# Patient Record
Sex: Female | Born: 1935
Health system: Southern US, Community
[De-identification: ages and names within clinical notes are randomized; demographics above are authoritative.]

## PROBLEM LIST (undated history)

## (undated) DIAGNOSIS — I10 Essential (primary) hypertension: Secondary | ICD-10-CM

## (undated) DIAGNOSIS — K5792 Diverticulitis of intestine, part unspecified, without perforation or abscess without bleeding: Secondary | ICD-10-CM

## (undated) DIAGNOSIS — N289 Disorder of kidney and ureter, unspecified: Secondary | ICD-10-CM

## (undated) HISTORY — PX: FRACTURE SURGERY: SHX138

## (undated) HISTORY — PX: ABDOMINAL HYSTERECTOMY: SHX81

---

## 1999-07-27 ENCOUNTER — Ambulatory Visit (HOSPITAL_COMMUNITY): Admission: RE | Admit: 1999-07-27 | Discharge: 1999-07-27 | Payer: Self-pay | Admitting: *Deleted

## 2000-03-04 ENCOUNTER — Encounter: Admission: RE | Admit: 2000-03-04 | Discharge: 2000-03-04 | Payer: Self-pay | Admitting: Internal Medicine

## 2000-03-04 ENCOUNTER — Encounter: Payer: Self-pay | Admitting: Internal Medicine

## 2000-10-25 ENCOUNTER — Other Ambulatory Visit: Admission: RE | Admit: 2000-10-25 | Discharge: 2000-10-25 | Payer: Self-pay | Admitting: Internal Medicine

## 2001-03-10 ENCOUNTER — Encounter: Admission: RE | Admit: 2001-03-10 | Discharge: 2001-03-10 | Payer: Self-pay | Admitting: Internal Medicine

## 2001-03-10 ENCOUNTER — Encounter: Payer: Self-pay | Admitting: Internal Medicine

## 2002-03-29 ENCOUNTER — Encounter: Admission: RE | Admit: 2002-03-29 | Discharge: 2002-03-29 | Payer: Self-pay | Admitting: Internal Medicine

## 2002-03-29 ENCOUNTER — Encounter: Payer: Self-pay | Admitting: Internal Medicine

## 2003-05-23 ENCOUNTER — Encounter: Payer: Self-pay | Admitting: Internal Medicine

## 2003-05-23 ENCOUNTER — Encounter: Admission: RE | Admit: 2003-05-23 | Discharge: 2003-05-23 | Payer: Self-pay | Admitting: Internal Medicine

## 2004-07-06 ENCOUNTER — Encounter: Admission: RE | Admit: 2004-07-06 | Discharge: 2004-07-06 | Payer: Self-pay | Admitting: Internal Medicine

## 2005-10-04 ENCOUNTER — Encounter: Admission: RE | Admit: 2005-10-04 | Discharge: 2005-10-04 | Payer: Self-pay | Admitting: Internal Medicine

## 2006-10-05 ENCOUNTER — Encounter: Admission: RE | Admit: 2006-10-05 | Discharge: 2006-10-05 | Payer: Self-pay | Admitting: Internal Medicine

## 2007-08-11 ENCOUNTER — Encounter: Admission: RE | Admit: 2007-08-11 | Discharge: 2007-08-11 | Payer: Self-pay | Admitting: Internal Medicine

## 2007-11-24 ENCOUNTER — Encounter: Admission: RE | Admit: 2007-11-24 | Discharge: 2007-11-24 | Payer: Self-pay

## 2007-12-05 ENCOUNTER — Encounter: Admission: RE | Admit: 2007-12-05 | Discharge: 2007-12-05 | Payer: Self-pay

## 2009-03-27 ENCOUNTER — Encounter: Admission: RE | Admit: 2009-03-27 | Discharge: 2009-03-27 | Payer: Self-pay | Admitting: Family Medicine

## 2018-03-02 ENCOUNTER — Emergency Department (HOSPITAL_BASED_OUTPATIENT_CLINIC_OR_DEPARTMENT_OTHER)
Admission: EM | Admit: 2018-03-02 | Discharge: 2018-03-02 | Disposition: A | Discharge status: Home / Self Care | Payer: Medicare HMO | Attending: Physician Assistant | Admitting: Physician Assistant

## 2018-03-02 ENCOUNTER — Other Ambulatory Visit: Payer: Self-pay

## 2018-03-02 ENCOUNTER — Emergency Department (HOSPITAL_BASED_OUTPATIENT_CLINIC_OR_DEPARTMENT_OTHER): Payer: Medicare HMO

## 2018-03-02 ENCOUNTER — Encounter: Payer: Self-pay | Admitting: Emergency Medicine

## 2018-03-02 DIAGNOSIS — R1030 Lower abdominal pain, unspecified: Secondary | ICD-10-CM | POA: Diagnosis present

## 2018-03-02 DIAGNOSIS — K529 Noninfective gastroenteritis and colitis, unspecified: Secondary | ICD-10-CM | POA: Diagnosis not present

## 2018-03-02 HISTORY — DX: Essential (primary) hypertension: I10

## 2018-03-02 HISTORY — DX: Disorder of kidney and ureter, unspecified: N28.9

## 2018-03-02 HISTORY — DX: Diverticulitis of intestine, part unspecified, without perforation or abscess without bleeding: K57.92

## 2018-03-02 LAB — COMPREHENSIVE METABOLIC PANEL
ALT: 17 U/L (ref 14–54)
AST: 27 U/L (ref 15–41)
Albumin: 4.3 g/dL (ref 3.5–5.0)
Alkaline Phosphatase: 60 U/L (ref 38–126)
Anion gap: 11 (ref 5–15)
BUN: 47 mg/dL — AB (ref 6–20)
CHLORIDE: 106 mmol/L (ref 101–111)
CO2: 19 mmol/L — AB (ref 22–32)
CREATININE: 2.58 mg/dL — AB (ref 0.44–1.00)
Calcium: 8.8 mg/dL — ABNORMAL LOW (ref 8.9–10.3)
GFR calc non Af Amer: 16 mL/min — ABNORMAL LOW (ref >60)
GFR, EST AFRICAN AMERICAN: 18 mL/min — AB (ref >60)
Glucose, Bld: 124 mg/dL — ABNORMAL HIGH (ref 65–99)
Potassium: 5 mmol/L (ref 3.5–5.1)
SODIUM: 136 mmol/L (ref 135–145)
Total Bilirubin: 0.9 mg/dL (ref 0.3–1.2)
Total Protein: 7.1 g/dL (ref 6.5–8.1)

## 2018-03-02 LAB — URINALYSIS, ROUTINE W REFLEX MICROSCOPIC
BILIRUBIN URINE: NEGATIVE
GLUCOSE, UA: NEGATIVE mg/dL
KETONES UR: NEGATIVE mg/dL
Nitrite: NEGATIVE
PROTEIN: NEGATIVE mg/dL
Specific Gravity, Urine: 1.02 (ref 1.005–1.030)
pH: 5.5 (ref 5.0–8.0)

## 2018-03-02 LAB — CBC
HCT: 33.7 % — ABNORMAL LOW (ref 36.0–46.0)
Hemoglobin: 11.6 g/dL — ABNORMAL LOW (ref 12.0–15.0)
MCH: 35 pg — AB (ref 26.0–34.0)
MCHC: 34.4 g/dL (ref 30.0–36.0)
MCV: 101.8 fL — AB (ref 78.0–100.0)
PLATELETS: 226 10*3/uL (ref 150–400)
RBC: 3.31 MIL/uL — AB (ref 3.87–5.11)
RDW: 11.8 % (ref 11.5–15.5)
WBC: 12.1 10*3/uL — ABNORMAL HIGH (ref 4.0–10.5)

## 2018-03-02 LAB — URINALYSIS, MICROSCOPIC (REFLEX)

## 2018-03-02 LAB — LIPASE, BLOOD: LIPASE: 30 U/L (ref 11–51)

## 2018-03-02 MED ORDER — SODIUM CHLORIDE 0.9 % IV BOLUS
1000.0000 mL | Freq: Once | INTRAVENOUS | Status: AC
  Administered 2018-03-02: 1000 mL via INTRAVENOUS

## 2018-03-02 MED ORDER — CIPROFLOXACIN HCL 500 MG PO TABS: 500.0000 mg | ORAL_TABLET | Freq: Two times a day (BID) | ORAL | 0 refills | Status: DC

## 2018-03-02 MED ORDER — ONDANSETRON HCL 4 MG/2ML IJ SOLN
4.0000 mg | Freq: Once | INTRAMUSCULAR | Status: AC
  Administered 2018-03-02: 4 mg via INTRAVENOUS
  Filled 2018-03-02: qty 2

## 2018-03-02 MED ORDER — METRONIDAZOLE 500 MG PO TABS: 500.0000 mg | ORAL_TABLET | Freq: Two times a day (BID) | ORAL | 0 refills | Status: DC

## 2018-03-02 NOTE — ED Provider Notes (Signed)
[facility] EMERGENCY DEPARTMENT Provider Note   CSN: [id number] Arrival date & time: 03/02/18  1657     History   Chief Complaint Chief Complaint  Patient presents with  . Abdominal Pain    HPI Tracie Tate is a 84 y.o. female.  HPI   Ms. Lewan is an 84yo female with a history of hypertension, diverticulitis, CKD stage III, recurrent UTI who presents emergency department for evaluation of vomiting, diarrhea and lower abdominal pain.  Patient reports that she ate pork and cornbread last night.  About an hour after eating she had a large volume of nonbloody emesis.  She then proceeded to have 4 episodes of nonbloody diarrhea overnight.  She states that she had some bilateral lower abdominal cramping with diarrhea, but denies abdominal pain currently.  She states that she has not had anything but ice chips today and one frozen grape.  She has not voided today.  States that over the past 2 days she has had some burning with urination, although she states this is typical for her and she normally takes Azo's for recurrent UTIs.  States that she felt chills overnight, but denies measured fever.  She feels overall weak.  She denies chest pain, shortness of breath, melena, hematochezia, vaginal bleeding, vaginal discharge, lightheadedness, syncope.  She has had an abdominal hysterectomy procedure, denies any other abdominal surgeries.    Past Medical History:  Diagnosis Date  . Diverticulitis   . Hypertension   . Renal disorder     There are no active problems to display for this patient.   History reviewed. No pertinent surgical history.   OB History   None      Home Medications    Prior to Admission medications   Not on File    Family History History reviewed. No pertinent family history.  Social History Social History   Tobacco Use  . Smoking status: Never Smoker  . Smokeless tobacco: Never Used  Substance Use Topics  . Alcohol use: Never   Frequency: Never  . Drug use: Never     Allergies   Nitrofurantoin; Penicillins; Sulfa antibiotics; Amitriptyline; Fluticasone; and Simvastatin   Review of Systems Review of Systems  Constitutional: Positive for chills. Negative for fever.  Eyes: Negative for visual disturbance.  Respiratory: Negative for shortness of breath.   Cardiovascular: Negative for chest pain.  Gastrointestinal: Positive for abdominal pain, diarrhea and vomiting. Negative for blood in stool.  Genitourinary: Positive for dysuria. Negative for frequency, hematuria and vaginal discharge.  Musculoskeletal: Negative for gait problem.  Skin: Negative for rash.  Neurological: Positive for weakness. Negative for syncope, light-headedness, numbness and headaches.  Psychiatric/Behavioral: Negative for agitation.     Physical Exam Updated Vital Signs BP (!) 121/56 (BP Location: Right Arm)   Pulse 99   Temp 98.3 F (36.8 C) (Oral)   Resp 18   SpO2 98%   Physical Exam  Constitutional: She is oriented to person, place, and time. She appears well-developed and well-nourished. No distress.  Non-toxic appearing. NAD.   HENT:  Head: Normocephalic and atraumatic.  Mouth/Throat: Oropharynx is clear and moist.  Mucous membranes somewhat dry.   Eyes: Right eye exhibits no discharge. Left eye exhibits no discharge.  Cardiovascular: Normal rate and regular rhythm.  Pulmonary/Chest: Effort normal and breath sounds normal. No stridor. No respiratory distress. She has no wheezes. She has no rales.  Abdominal:  Abdomen soft and non-distended. Diffusely tender to palpation, worse over the right peri-umbilical area.  No guarding or rigidity. No rebound tenderness. No CVA tenderness.   Neurological: She is alert and oriented to person, place, and time. Coordination normal.  Skin: Skin is warm and dry. Capillary refill takes less than 2 seconds. She is not diaphoretic.  Psychiatric: She has a normal mood and affect. Her  behavior is normal.  Nursing note and vitals reviewed.    ED Treatments / Results  Labs (all labs ordered are listed, but only abnormal results are displayed) Labs Reviewed  COMPREHENSIVE METABOLIC PANEL - Abnormal; Notable for the following components:      Result Value   CO2 19 (*)    Glucose, Bld 124 (*)    BUN 47 (*)    Creatinine, Ser 2.58 (*)    Calcium 8.8 (*)    GFR calc non Af Amer 16 (*)    GFR calc Af Amer 18 (*)    All other components within normal limits  CBC - Abnormal; Notable for the following components:   WBC 12.1 (*)    RBC 3.31 (*)    Hemoglobin 11.6 (*)    HCT 33.7 (*)    MCV 101.8 (*)    MCH 35.0 (*)    All other components within normal limits  LIPASE, BLOOD  URINALYSIS, ROUTINE W REFLEX MICROSCOPIC    EKG None  Radiology No results found.  Procedures Procedures (including critical care time)  Medications Ordered in ED Medications  sodium chloride 0.9 % bolus 1,000 mL (1,000 mLs Intravenous New Bag/Given 03/02/18 1742)  sodium chloride 0.9 % bolus 1,000 mL (1,000 mLs Intravenous New Bag/Given 03/02/18 1915)  ondansetron (ZOFRAN) injection 4 mg (4 mg Intravenous Given 03/02/18 1914)     Initial Impression / Assessment and Plan / ED Course  I have reviewed the triage vital signs and the nursing notes.  Pertinent labs & imaging results that were available during my care of the patient were reviewed by me and considered in my medical decision making (see chart for details).     84 year old female who resents emergency department for evaluation of vomiting, diarrhea and lower abdominal cramping.  This started yesterday.  Other than ice chips, she has not had anything to drink or eat today.  Has not voided today.   On exam she is afebrile and nontoxic-appearing.  She is diffusely tender over the abdomen, no peritoneal signs.  Mucous membranes dry.  Labs reviewed.  She has an AKI with creatinine 2.58 (baseline 1.5).  Suspect that this is  prerenal in the setting of poor fluid intake today.  Fluid bolus running.  She also has a mild leukocytosis with WBC count 12.1.  She has a macrocytic anemia with hemoglobin 11.6.  Lipase within normal.  She has not voided in the ED to collect UA sample. CT abdomen noncontrast pending.   Sign out given to MD Kandis Mannan at shift change for disposition following return of CT scan.   Final Clinical Impressions(s) / ED Diagnoses   Final diagnoses:  None    ED Discharge Orders    None       Lawrence Marseilles 03/02/18 2005    Mackuen, Cindee Salt, MD 03/02/18 2211

## 2018-03-02 NOTE — Discharge Instructions (Signed)
You were seen today with an infection in your bowels.  We are giving antibiotics to help resolve that.  Please follow-up with your primary care as soon as possible to get your labs redone.  We recommended that you would be admitted to hospital but he did not wish to at this time.  If you at any time feel like you would like to change your mind you are welcome back.  He is really tried to stay hydrated with a mix of Gatorade and water.

## 2018-03-02 NOTE — ED Triage Notes (Signed)
Present with diarrhea and vomiting that began last night, dneis fever, endorses chills. Reports lower abdominal pain.

## 2018-03-02 NOTE — ED Notes (Signed)
Pt able to tolerate PO fluids no sign of nausea or vomiting.

## 2018-10-04 ENCOUNTER — Emergency Department (HOSPITAL_BASED_OUTPATIENT_CLINIC_OR_DEPARTMENT_OTHER): Payer: Medicare HMO

## 2018-10-04 ENCOUNTER — Emergency Department (HOSPITAL_BASED_OUTPATIENT_CLINIC_OR_DEPARTMENT_OTHER)
Admission: EM | Admit: 2018-10-04 | Discharge: 2018-10-04 | Disposition: A | Discharge status: Home / Self Care | Payer: Medicare HMO | Attending: Emergency Medicine | Admitting: Emergency Medicine

## 2018-10-04 ENCOUNTER — Encounter (HOSPITAL_BASED_OUTPATIENT_CLINIC_OR_DEPARTMENT_OTHER): Payer: Self-pay | Admitting: Emergency Medicine

## 2018-10-04 ENCOUNTER — Other Ambulatory Visit: Payer: Self-pay

## 2018-10-04 DIAGNOSIS — N390 Urinary tract infection, site not specified: Secondary | ICD-10-CM

## 2018-10-04 DIAGNOSIS — Z79899 Other long term (current) drug therapy: Secondary | ICD-10-CM | POA: Insufficient documentation

## 2018-10-04 DIAGNOSIS — R531 Weakness: Secondary | ICD-10-CM

## 2018-10-04 DIAGNOSIS — Z7982 Long term (current) use of aspirin: Secondary | ICD-10-CM | POA: Insufficient documentation

## 2018-10-04 DIAGNOSIS — I10 Essential (primary) hypertension: Secondary | ICD-10-CM | POA: Diagnosis not present

## 2018-10-04 LAB — CBC WITH DIFFERENTIAL/PLATELET
ABS IMMATURE GRANULOCYTES: 0.01 10*3/uL (ref 0.00–0.07)
BASOS ABS: 0 10*3/uL (ref 0.0–0.1)
BASOS PCT: 1 %
Eosinophils Absolute: 0 10*3/uL (ref 0.0–0.5)
Eosinophils Relative: 0 %
HCT: 36.2 % (ref 36.0–46.0)
Hemoglobin: 11.5 g/dL — ABNORMAL LOW (ref 12.0–15.0)
IMMATURE GRANULOCYTES: 0 %
LYMPHS ABS: 1.4 10*3/uL (ref 0.7–4.0)
Lymphocytes Relative: 37 %
MCH: 33.1 pg (ref 26.0–34.0)
MCHC: 31.8 g/dL (ref 30.0–36.0)
MCV: 104.3 fL — AB (ref 80.0–100.0)
Monocytes Absolute: 0.4 10*3/uL (ref 0.1–1.0)
Monocytes Relative: 11 %
NEUTROS ABS: 1.9 10*3/uL (ref 1.7–7.7)
NRBC: 0 % (ref 0.0–0.2)
Neutrophils Relative %: 51 %
PLATELETS: 175 10*3/uL (ref 150–400)
RBC: 3.47 MIL/uL — ABNORMAL LOW (ref 3.87–5.11)
RDW: 11.9 % (ref 11.5–15.5)
WBC: 3.7 10*3/uL — ABNORMAL LOW (ref 4.0–10.5)

## 2018-10-04 LAB — URINALYSIS, ROUTINE W REFLEX MICROSCOPIC
Glucose, UA: NEGATIVE mg/dL
KETONES UR: 15 mg/dL — AB
NITRITE: POSITIVE — AB
PROTEIN: 30 mg/dL — AB
Specific Gravity, Urine: 1.025 (ref 1.005–1.030)
pH: 6 (ref 5.0–8.0)

## 2018-10-04 LAB — COMPREHENSIVE METABOLIC PANEL
ALBUMIN: 3.9 g/dL (ref 3.5–5.0)
ALT: 16 U/L (ref 0–44)
ANION GAP: 9 (ref 5–15)
AST: 27 U/L (ref 15–41)
Alkaline Phosphatase: 47 U/L (ref 38–126)
BUN: 26 mg/dL — ABNORMAL HIGH (ref 8–23)
CO2: 22 mmol/L (ref 22–32)
Calcium: 8.7 mg/dL — ABNORMAL LOW (ref 8.9–10.3)
Chloride: 103 mmol/L (ref 98–111)
Creatinine, Ser: 1.6 mg/dL — ABNORMAL HIGH (ref 0.44–1.00)
GFR calc Af Amer: 33 mL/min — ABNORMAL LOW (ref >60)
GFR calc non Af Amer: 28 mL/min — ABNORMAL LOW (ref >60)
GLUCOSE: 92 mg/dL (ref 70–99)
POTASSIUM: 4.7 mmol/L (ref 3.5–5.1)
SODIUM: 134 mmol/L — AB (ref 135–145)
TOTAL PROTEIN: 7.3 g/dL (ref 6.5–8.1)
Total Bilirubin: 0.9 mg/dL (ref 0.3–1.2)

## 2018-10-04 LAB — URINALYSIS, MICROSCOPIC (REFLEX): WBC, UA: >50 WBC/hpf (ref 0–5)

## 2018-10-04 LAB — I-STAT CG4 LACTIC ACID, ED: Lactic Acid, Venous: 1.23 mmol/L (ref 0.5–1.9)

## 2018-10-04 MED ORDER — SODIUM CHLORIDE 0.9 % IV SOLN
INTRAVENOUS | Status: DC | PRN
  Administered 2018-10-04: 250 mL via INTRAVENOUS

## 2018-10-04 MED ORDER — SODIUM CHLORIDE 0.9 % IV SOLN
1.0000 g | Freq: Once | INTRAVENOUS | Status: AC
  Administered 2018-10-04: 1 g via INTRAVENOUS
  Filled 2018-10-04: qty 10

## 2018-10-04 MED ORDER — SODIUM CHLORIDE 0.9 % IV BOLUS
1000.0000 mL | Freq: Once | INTRAVENOUS | Status: AC
  Administered 2018-10-04: 1000 mL via INTRAVENOUS

## 2018-10-04 MED ORDER — CEPHALEXIN 500 MG PO CAPS: 500.0000 mg | ORAL_CAPSULE | Freq: Two times a day (BID) | ORAL | 0 refills | Status: AC

## 2018-10-04 MED FILL — CEPHALEXIN 500 MG CAPSULE: 500 | 7 days supply | Qty: 14 | Fill #0

## 2018-10-04 NOTE — ED Notes (Signed)
Patient transported to X-ray 

## 2018-10-04 NOTE — ED Notes (Signed)
ED Provider at bedside. 

## 2018-10-04 NOTE — ED Provider Notes (Signed)
[facility] EMERGENCY DEPARTMENT Provider Note   CSN: [id number] Arrival date & time: 10/04/18  [id number]     History   Chief Complaint Chief Complaint  Patient presents with  . requesting fluids    HPI Tracie Tate is a 84 y.o. female.  84yo F w/ PMH including diverticulitis/colitis, HTN who p/w weakness and cough. PT reports h/o colitis that usually causes significant diarrhea for 1 day. She had non-bloody diarrhea overnight ~3 days ago that resolved. No ongoing diarrhea but she feels weak like she needs IV fluids. She reports a few days of cough that she thought was allergies but now thinks is a cold. She reports associated sore throat and runny nose. No vomiting. Has had some burning w/ urination that improved w/ AZO a few days ago. No complaints of pain. No sick contacts or fever.  The history is provided by the patient.    Past Medical History:  Diagnosis Date  . Diverticulitis   . Hypertension   . Renal disorder     There are no active problems to display for this patient.   Past Surgical History:  Procedure Laterality Date  . ABDOMINAL HYSTERECTOMY    . FRACTURE SURGERY       OB History   No obstetric history on file.      Home Medications    Prior to Admission medications   Medication Sig Start Date End Date Taking? Authorizing Provider  aspirin EC 81 MG tablet Take by mouth. 01/22/16  Yes [provider]  cephALEXin (KEFLEX) 500 MG capsule Take 1 capsule (500 mg total) by mouth 2 (two) times daily. 10/04/18   Little, Ambrose Finlandachel Morgan, MD  lisinopril (PRINIVIL,ZESTRIL) 10 MG tablet Take 10 mg by mouth daily.    [provider]  Omega-3 1000 MG CAPS Take by mouth.    [provider]  traMADol (ULTRAM) 50 MG tablet Take by mouth.    [provider]    Family History No family history on file.  Social History Social History   Tobacco Use  . Smoking status: Never Smoker  . Smokeless tobacco: Never  Used  Substance Use Topics  . Alcohol use: Never    Frequency: Never  . Drug use: Never     Allergies   Nitrofurantoin; Penicillins; Sulfa antibiotics; Amitriptyline; Fluticasone; and Simvastatin   Review of Systems Review of Systems All other systems reviewed and are negative except that which was mentioned in HPI   Physical Exam Updated Vital Signs BP (!) 154/92 (BP Location: Right Arm)   Pulse 92   Temp 98.2 F (36.8 C) (Oral)   Resp 18   Ht 5\' 3"  (1.6 m)   Wt 61.7 kg   SpO2 100%   BMI 24.09 kg/m   Physical Exam Vitals signs and nursing note reviewed.  Constitutional:      General: She is not in acute distress.    Appearance: She is well-developed.  HENT:     Head: Normocephalic and atraumatic.     Mouth/Throat:     Mouth: Mucous membranes are moist.     Pharynx: Oropharynx is clear. No oropharyngeal exudate or posterior oropharyngeal erythema.  Eyes:     Conjunctiva/sclera: Conjunctivae normal.  Neck:     Musculoskeletal: Neck supple.  Cardiovascular:     Rate and Rhythm: Normal rate and regular rhythm.     Heart sounds: Normal heart sounds. No murmur.  Pulmonary:     Effort: Pulmonary effort is normal.  Breath sounds: Normal breath sounds.  Abdominal:     General: Bowel sounds are normal. There is no distension.     Palpations: Abdomen is soft.     Tenderness: There is no abdominal tenderness.  Skin:    General: Skin is warm and dry.  Neurological:     Mental Status: She is alert and oriented to person, place, and time.     Comments: Fluent speech  Psychiatric:        Judgment: Judgment normal.      ED Treatments / Results  Labs (all labs ordered are listed, but only abnormal results are displayed) Labs Reviewed  COMPREHENSIVE METABOLIC PANEL - Abnormal; Notable for the following components:      Result Value   Sodium 134 (*)    BUN 26 (*)    Creatinine, Ser 1.60 (*)    Calcium 8.7 (*)    GFR calc non Af Amer 28 (*)    GFR calc Af  Amer 33 (*)    All other components within normal limits  CBC WITH DIFFERENTIAL/PLATELET - Abnormal; Notable for the following components:   WBC 3.7 (*)    RBC 3.47 (*)    Hemoglobin 11.5 (*)    MCV 104.3 (*)    All other components within normal limits  URINALYSIS, ROUTINE W REFLEX MICROSCOPIC - Abnormal; Notable for the following components:   APPearance CLOUDY (*)    Hgb urine dipstick SMALL (*)    Bilirubin Urine SMALL (*)    Ketones, ur 15 (*)    Protein, ur 30 (*)    Nitrite POSITIVE (*)    Leukocytes, UA MODERATE (*)    All other components within normal limits  URINALYSIS, MICROSCOPIC (REFLEX) - Abnormal; Notable for the following components:   Bacteria, UA MANY (*)    All other components within normal limits  URINE CULTURE  I-STAT CG4 LACTIC ACID, ED  I-STAT CG4 LACTIC ACID, ED    EKG None  Radiology Dg Chest 2 View  Result Date: 10/04/2018 CLINICAL DATA:  Weakness and cough for several days EXAM: CHEST - 2 VIEW COMPARISON:  None. FINDINGS: The heart size and mediastinal contours are within normal limits. Both lungs are clear. The visualized skeletal structures are unremarkable. Calcified granuloma is noted in the left mid lung. IMPRESSION: No active cardiopulmonary disease. Electronically Signed   By: Alcide Clever M.D.   On: 10/04/2018 10:02    Procedures Procedures (including critical care time)  Medications Ordered in ED Medications  0.9 %  sodium chloride infusion (250 mLs Intravenous New Bag/Given 10/04/18 1116)  sodium chloride 0.9 % bolus 1,000 mL ( Intravenous Stopped 10/04/18 1134)  cefTRIAXone (ROCEPHIN) 1 g in sodium chloride 0.9 % 100 mL IVPB (1 g Intravenous New Bag/Given 10/04/18 1117)     Initial Impression / Assessment and Plan / ED Course  I have reviewed the triage vital signs and the nursing notes.  Pertinent labs & imaging results that were available during my care of the patient were reviewed by me and considered in my medical decision  making (see chart for details).    PT comfortable on exam w/ reassuring VS, afebrile. Clear lung sounds, abd non-tender. CXR clear. Labs show cr 1.6 which is improved from previous, WBC 3.7 which may be related to viral URI symptoms. Lactate normal. UA c/w infection.  May be what is driving her generalized weakness, especially considering diarrhea has not occurred for 2 days.  Her chart shows that she has  tolerated cephalosporins previously.  Gave ceftriaxone here and a course of Keflex to complete at home.  She feels better after fluids and antibiotics is well-appearing on reassessment.  Discussed follow-up with PCP to ensure improvement and reviewed return precautions.  She voiced understanding.  Final Clinical Impressions(s) / ED Diagnoses   Final diagnoses:  Urinary tract infection without hematuria, site unspecified  Generalized weakness    ED Discharge Orders         Ordered    cephALEXin (KEFLEX) 500 MG capsule  2 times daily    Note to Pharmacy:  Has tolerated cephalosporins previously   10/04/18 1209           Little, Ambrose Finland, MD 10/04/18 1212

## 2018-10-04 NOTE — ED Notes (Signed)
ED Provider at bedside discussing test results and dispo plan of care. 

## 2018-10-04 NOTE — ED Triage Notes (Addendum)
Hx of colitis.  Pt sts she has bouts of diarrhea for which she must be rehydrated and pmd sent her here today for that rehydration. Pt  Had diarrhea on Sunday but has not had it for the past 2 days. Pt also has cough and head cold x4 days.

## 2018-10-07 LAB — URINE CULTURE: Culture: >=100000 — AB

## 2018-11-09 IMAGING — CT CT ABD-PELV W/O CM
2 of 4 series · 16 of 46 positions shown, 18 images · non-contrast
Comparison: 08/11/2007

CLINICAL DATA: Lower abdominal pain

EXAM:
CT ABDOMEN AND PELVIS WITHOUT CONTRAST
TECHNIQUE: Multidetector CT imaging of the abdomen and pelvis was performed
following the standard protocol without IV contrast.

[Series 2: axial st · axial · 0.73mm/px · z∈[-551,-131]mm · 13 of 92 slices shown, 15 images]
[im 4/92  soft-tissue]
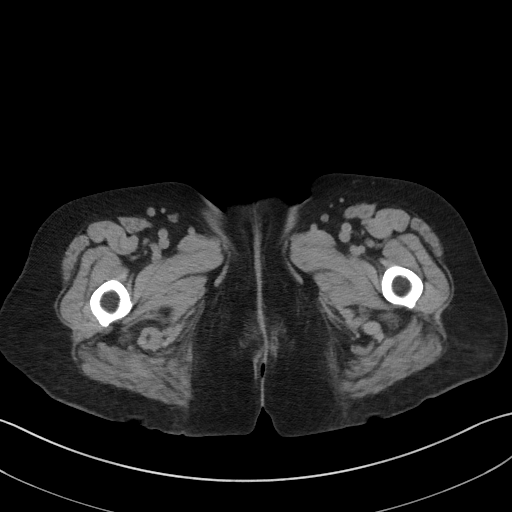
[im 4/92  bone]
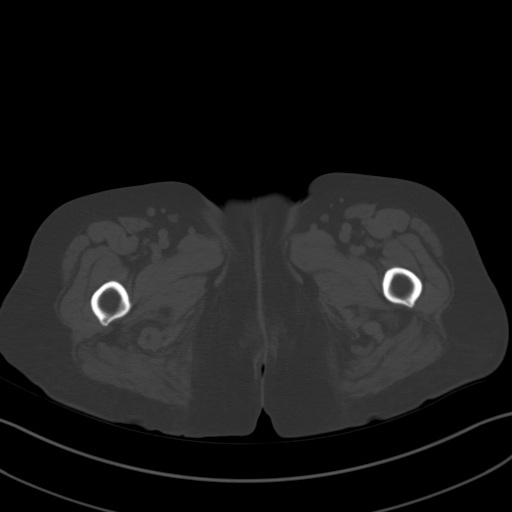
[im 12/92  soft-tissue]
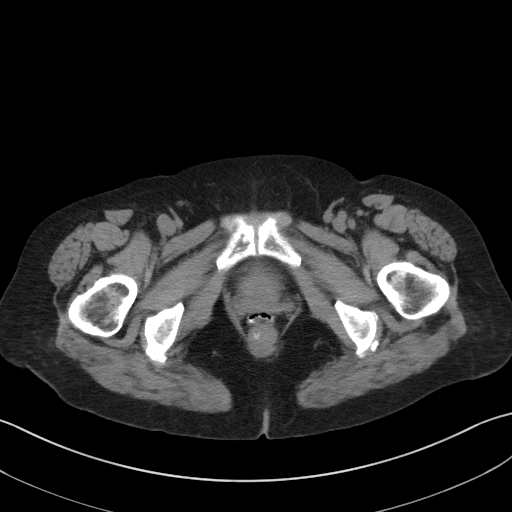
[im 19/92  soft-tissue]
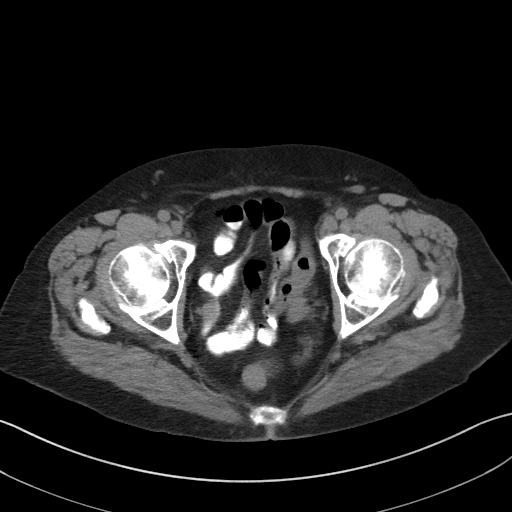
[im 27/92  soft-tissue]
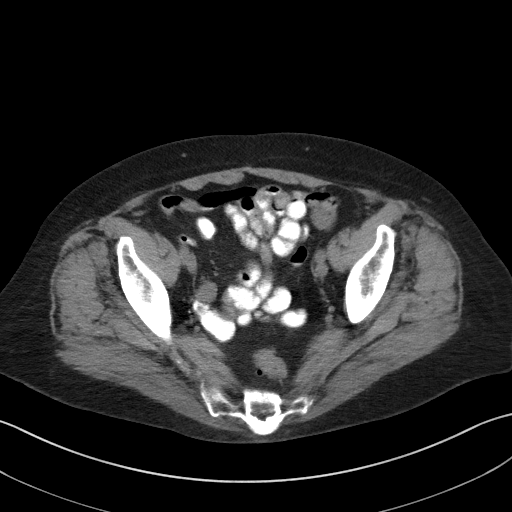
[im 31/92  soft-tissue]
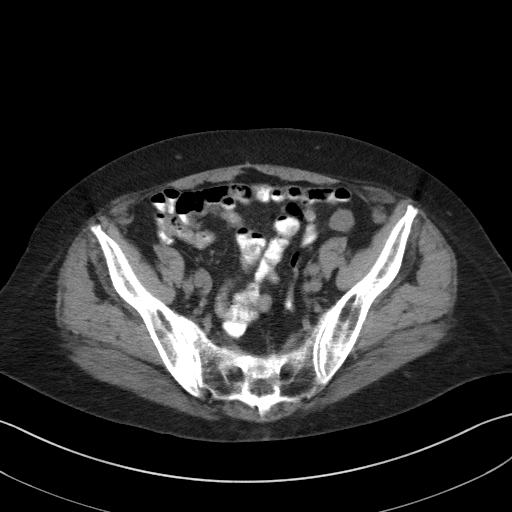
[im 38/92  soft-tissue]
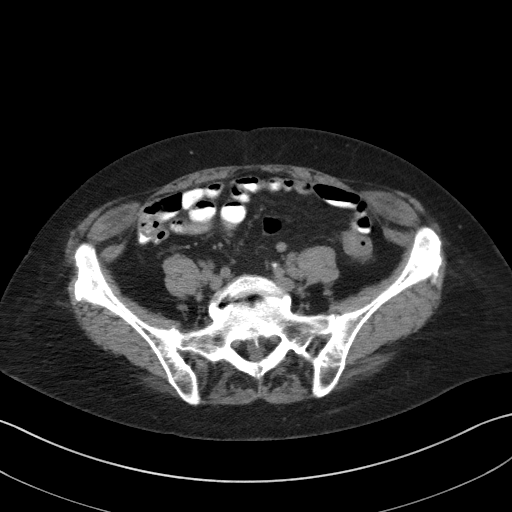
[im 46/92  soft-tissue]
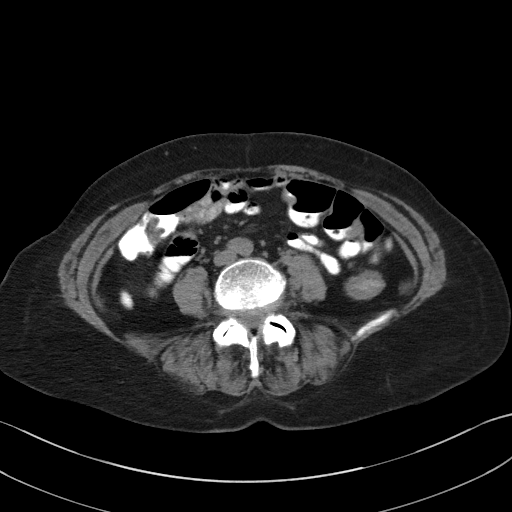
[im 54/92  soft-tissue]
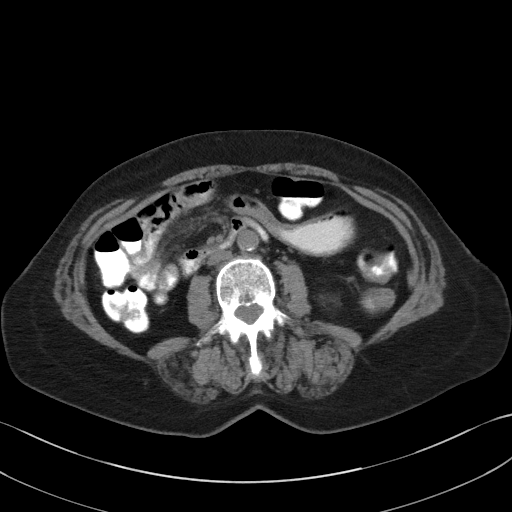
[im 61/92  soft-tissue]
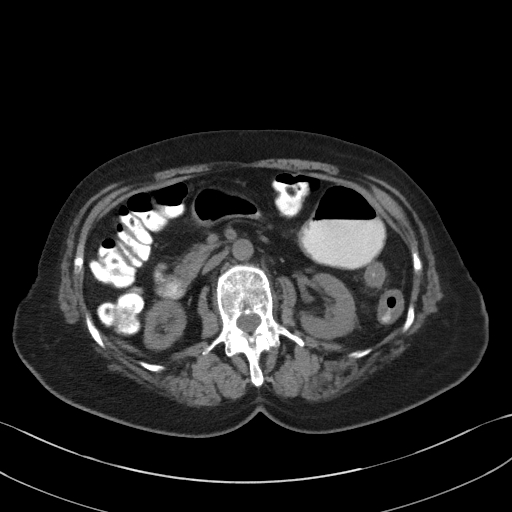
[im 61/92  bone]
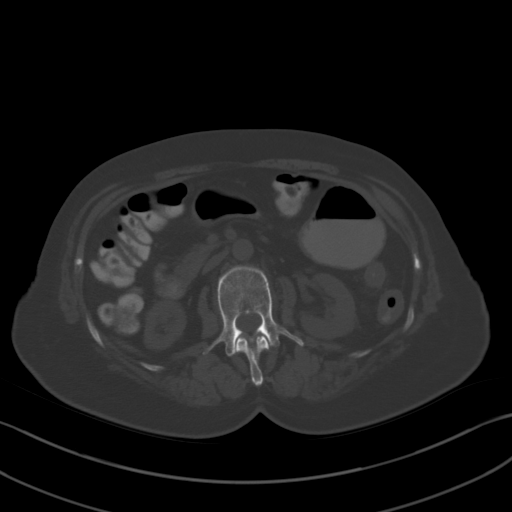
[im 65/92  soft-tissue]
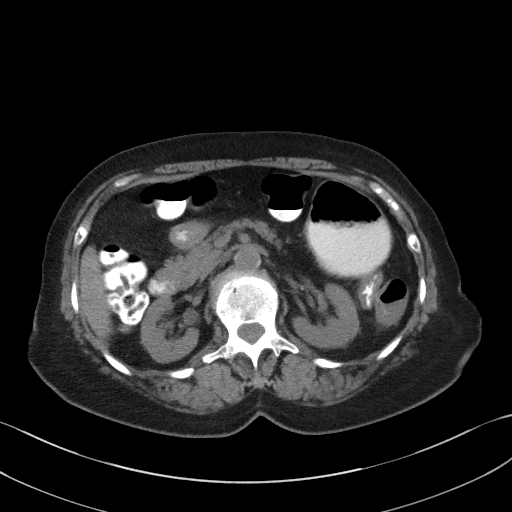
[im 73/92  soft-tissue]
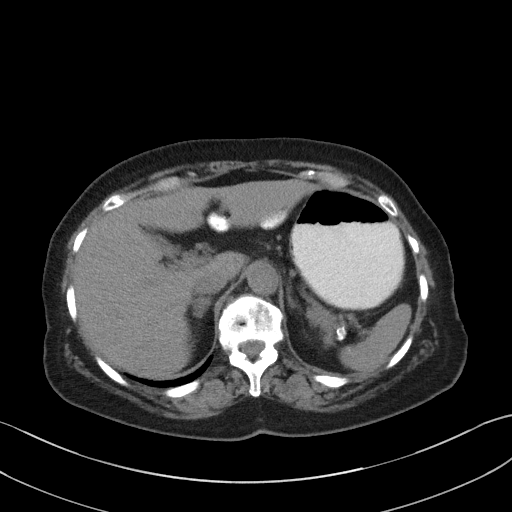
[im 80/92  soft-tissue]
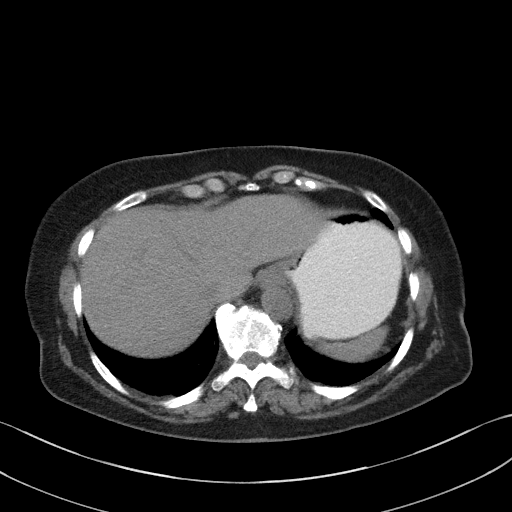
[im 88/92  soft-tissue]
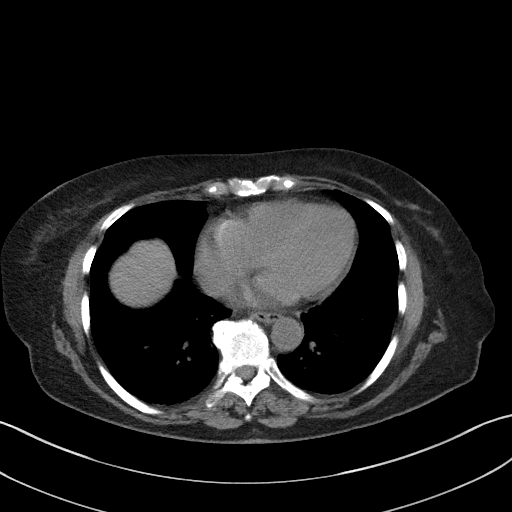

[Series 5: coronal st · coronal · 0.74mm/px · 3 of 73 slices shown]
[im 25/73  soft-tissue]
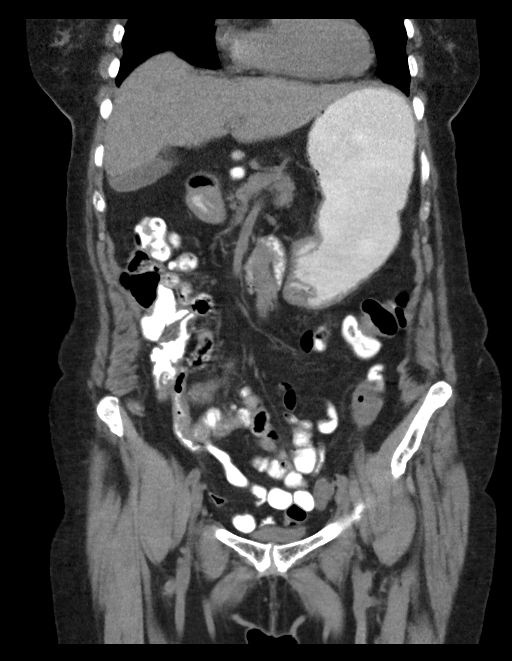
[im 33/73  soft-tissue]
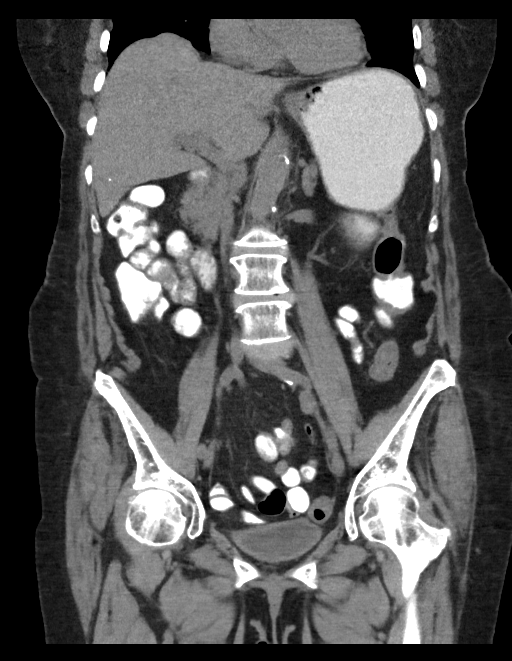
[im 41/73  soft-tissue]
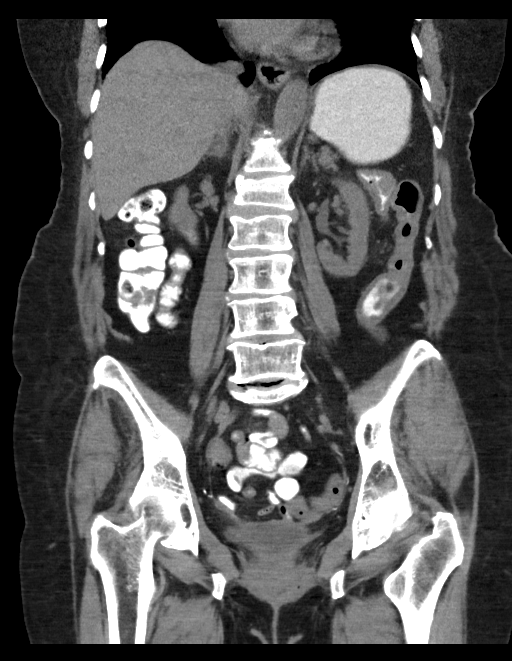

[16 of 46 positions shown; findings below may reference images not displayed]

FINDINGS: Lower chest: Lung bases demonstrate no acute consolidation or
pleural effusion. The heart size is within normal limits. Small
hiatal hernia.

Hepatobiliary: Calcified granuloma. No calcified stones or biliary
dilatation.

Pancreas: Unremarkable. No pancreatic ductal dilatation or
surrounding inflammatory changes.

Spleen: Splenic granuloma.

Adrenals/Urinary Tract: Adrenal glands are within normal limits. No
hydronephrosis. The bladder is normal.

Stomach/Bowel: Stomach nonenlarged. No dilated small bowel. Appendix
not well seen but no right lower quadrant inflammation. Mild wall
thickening of the splenic flexure and descending colon.

Vascular/Lymphatic: Mild aortic atherosclerosis. No aneurysmal
dilatation. No significantly enlarged lymph nodes

Reproductive: Status post hysterectomy. No adnexal masses.

Other: Negative for free air or free fluid.

Musculoskeletal: Age indeterminate mild superior endplate
compression fractures at T12 and L1.
IMPRESSION: 1. Negative for hydronephrosis or ureteral stone
2. Mild wall thickening of the splenic flexure and descending colon
suggesting mild colitis.
3. Age indeterminate superior endplate compression fractures at T12
and L1.

## 2019-06-13 IMAGING — CR DG CHEST 2V
2 series · 2 of 2 positions shown · non-contrast
Comparison: None.

CLINICAL DATA: Weakness and cough for several days

EXAM:
CHEST - 2 VIEW

[w chest pa]
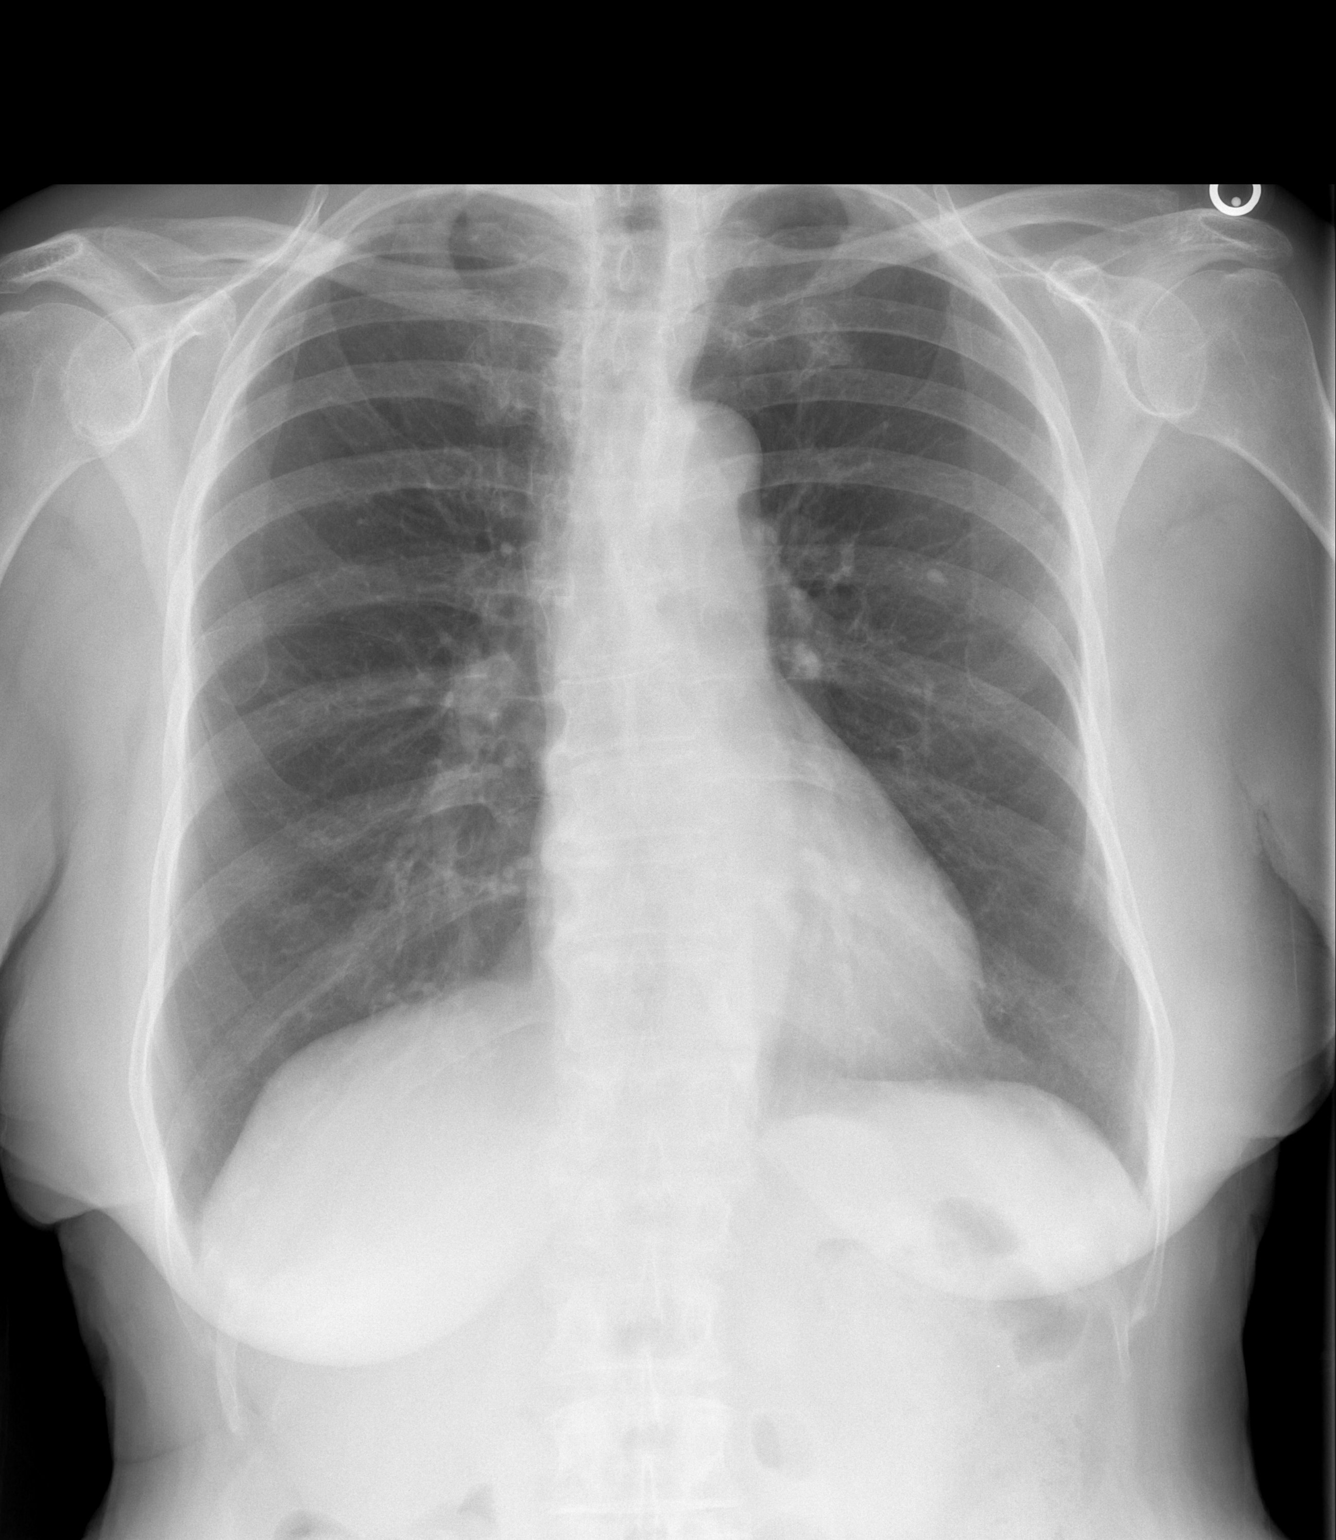

[w chest lat]
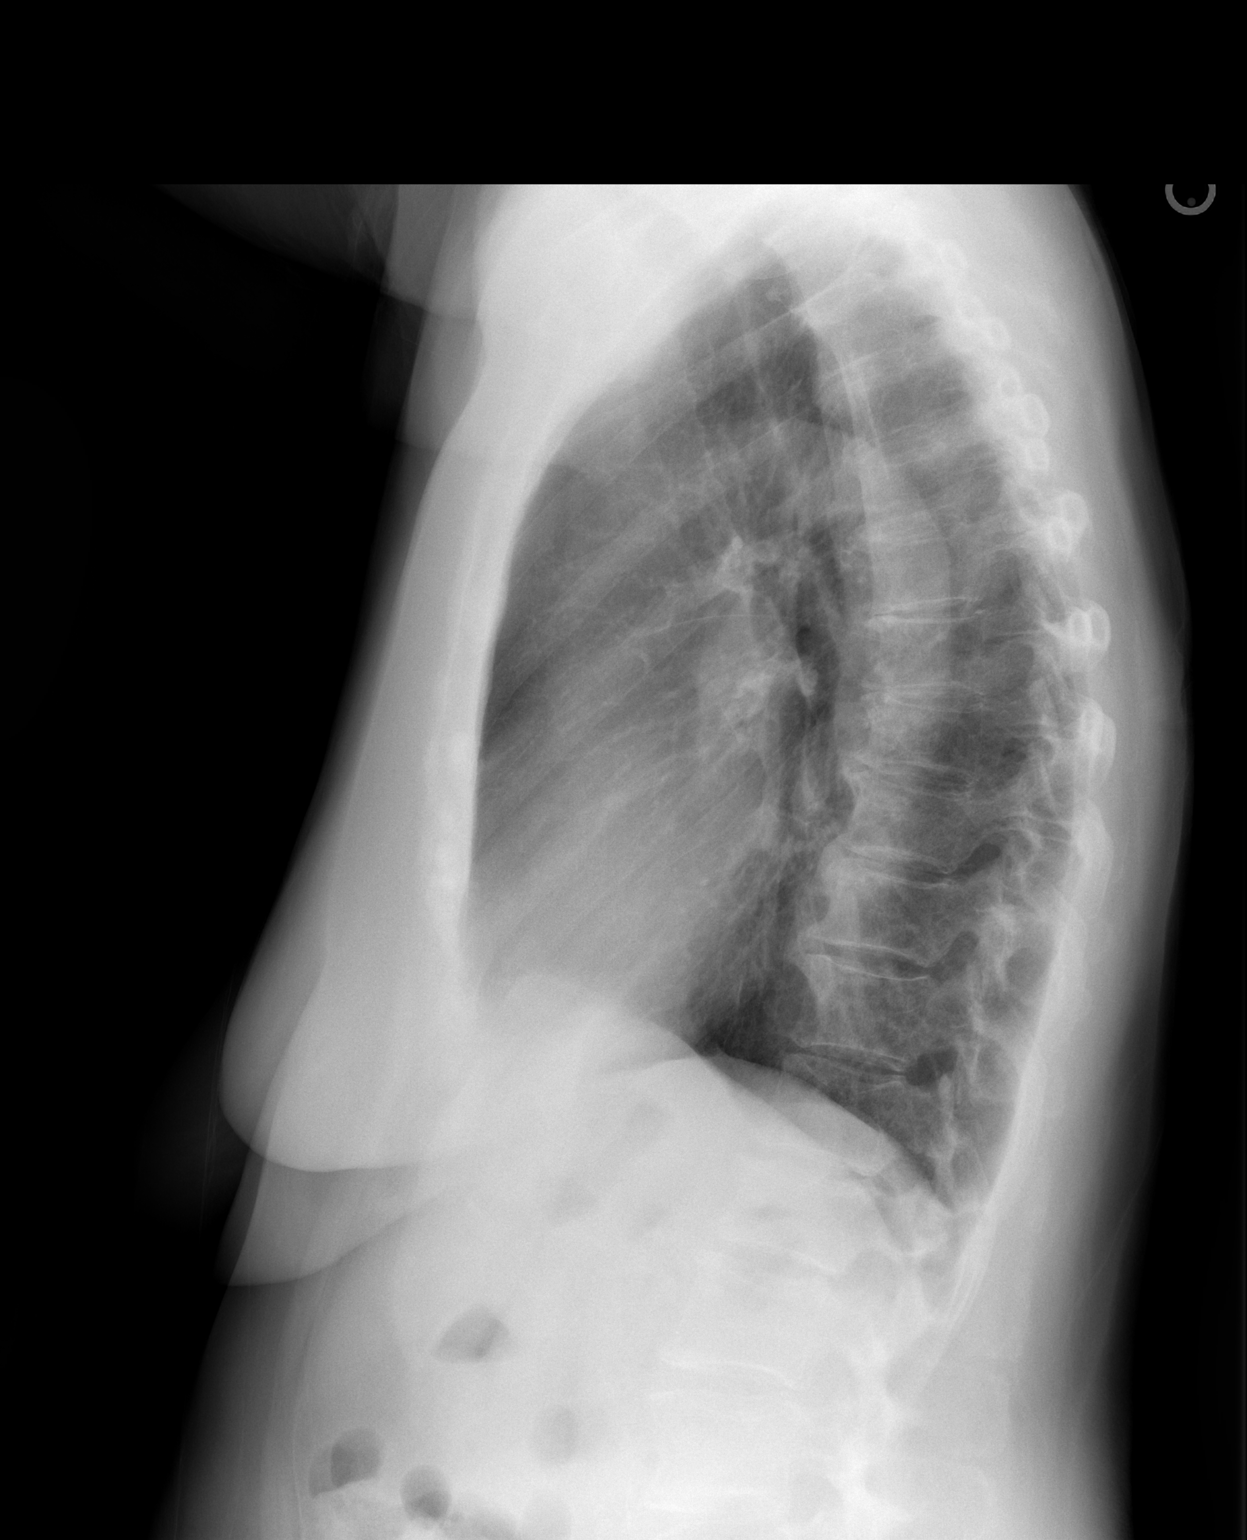

[2 of 2 positions shown; findings below may reference images not displayed]

FINDINGS: The heart size and mediastinal contours are within normal limits.
Both lungs are clear. The visualized skeletal structures are
unremarkable. Calcified granuloma is noted in the left mid lung.
IMPRESSION: No active cardiopulmonary disease.

## 2024-05-18 DEATH — deceased
